# Patient Record
Sex: Female | Born: 1965 | Hispanic: No | Marital: Married | Smoking: Never smoker
Health system: Southern US, Community
[De-identification: ages and names within clinical notes are randomized; demographics above are authoritative.]

## PROBLEM LIST (undated history)

## (undated) ENCOUNTER — Emergency Department (HOSPITAL_COMMUNITY): Admission: EM | Payer: Self-pay | Source: Home / Self Care

---

## 2005-09-09 ENCOUNTER — Other Ambulatory Visit: Admission: RE | Admit: 2005-09-09 | Discharge: 2005-09-09 | Payer: Self-pay | Admitting: Obstetrics and Gynecology

## 2008-07-20 ENCOUNTER — Emergency Department (HOSPITAL_COMMUNITY): Admission: EM | Admit: 2008-07-20 | Discharge: 2008-07-20 | Payer: Self-pay | Admitting: Emergency Medicine

## 2011-12-14 ENCOUNTER — Emergency Department (HOSPITAL_COMMUNITY)
Admission: EM | Admit: 2011-12-14 | Discharge: 2011-12-14 | Disposition: A | Discharge status: Home / Self Care | Payer: Managed Care, Other (non HMO) | Attending: Emergency Medicine | Admitting: Emergency Medicine

## 2011-12-14 ENCOUNTER — Emergency Department (HOSPITAL_COMMUNITY): Payer: Managed Care, Other (non HMO)

## 2011-12-14 ENCOUNTER — Encounter (HOSPITAL_COMMUNITY): Payer: Self-pay | Admitting: *Deleted

## 2011-12-14 DIAGNOSIS — M25519 Pain in unspecified shoulder: Secondary | ICD-10-CM | POA: Insufficient documentation

## 2011-12-14 DIAGNOSIS — M25559 Pain in unspecified hip: Secondary | ICD-10-CM | POA: Insufficient documentation

## 2011-12-14 DIAGNOSIS — S40019A Contusion of unspecified shoulder, initial encounter: Secondary | ICD-10-CM | POA: Insufficient documentation

## 2011-12-14 DIAGNOSIS — S7000XA Contusion of unspecified hip, initial encounter: Secondary | ICD-10-CM | POA: Insufficient documentation

## 2011-12-14 DIAGNOSIS — S7001XA Contusion of right hip, initial encounter: Secondary | ICD-10-CM

## 2011-12-14 DIAGNOSIS — S40011A Contusion of right shoulder, initial encounter: Secondary | ICD-10-CM

## 2011-12-14 DIAGNOSIS — IMO0002 Reserved for concepts with insufficient information to code with codable children: Secondary | ICD-10-CM | POA: Insufficient documentation

## 2011-12-14 DIAGNOSIS — T07XXXA Unspecified multiple injuries, initial encounter: Secondary | ICD-10-CM

## 2011-12-14 LAB — URINALYSIS, ROUTINE W REFLEX MICROSCOPIC
Bilirubin Urine: NEGATIVE
Glucose, UA: NEGATIVE mg/dL
Hgb urine dipstick: NEGATIVE
Ketones, ur: NEGATIVE mg/dL
Nitrite: NEGATIVE
Protein, ur: NEGATIVE mg/dL
Specific Gravity, Urine: 1.015 (ref 1.005–1.030)
Urobilinogen, UA: 0.2 mg/dL (ref 0.0–1.0)
pH: 6.5 (ref 5.0–8.0)

## 2011-12-14 LAB — POCT PREGNANCY, URINE: Preg Test, Ur: NEGATIVE

## 2011-12-14 LAB — URINE MICROSCOPIC-ADD ON

## 2011-12-14 MED ORDER — ONDANSETRON 8 MG PO TBDP
8.0000 mg | ORAL_TABLET | Freq: Once | ORAL | Status: AC
  Administered 2011-12-14: 8 mg via ORAL
  Filled 2011-12-14: qty 1

## 2011-12-14 MED ORDER — HYDROMORPHONE HCL PF 1 MG/ML IJ SOLN
1.0000 mg | Freq: Once | INTRAMUSCULAR | Status: AC
  Administered 2011-12-14: 1 mg via INTRAMUSCULAR
  Filled 2011-12-14: qty 1

## 2011-12-14 MED ORDER — OXYCODONE-ACETAMINOPHEN 5-325 MG PO TABS: 1.0000 | ORAL_TABLET | ORAL | Status: AC | PRN

## 2011-12-14 MED ORDER — IBUPROFEN 400 MG PO TABS: 400.0000 mg | ORAL_TABLET | Freq: Four times a day (QID) | ORAL | Status: AC | PRN

## 2011-12-14 MED ORDER — DIAZEPAM 5 MG PO TABS: 5.0000 mg | ORAL_TABLET | Freq: Three times a day (TID) | ORAL | Status: AC | PRN
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
## 2011-12-14 NOTE — ED Notes (Signed)
Natalia Leatherwood, NP to bedside for evaluation.

## 2011-12-14 NOTE — ED Notes (Signed)
Pt alert and oriented x4. Respirations even and unlabored, bilateral symmetrical rise and fall of chest. Skin warm and dry. In no acute distress. Denies needs.   

## 2011-12-14 NOTE — ED Notes (Signed)
md at bedside
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
## 2011-12-14 NOTE — Discharge Instructions (Signed)
Please read over the instructions below. Your xrays today were normal. You have many bruises and abrasions. Expect to be very sore over the next few days. Use ice to your sore areas for the first 24 hours then heat. Take the medications as directed. Return for worsening symptoms, otherwise follow up with your primary care doctor for a recheck upon your return home.      Hip Injury You have a been diagnosed with a hip injury. Falls can cause fractures to the pelvis and hip as well as very painful bruises. These are called 'hip pointers'. Muscle injuries, arthritis, sciatica, and bursitis and can also cause severe pelvic or hip pain. An x-ray exam will usually show a fractured bone, but sometimes other studies like a CT scan or MRI may be needed to be certain about the diagnosis. All painful hip injuries should be treated like there might be a fracture until they are better or determined not to be fractures. Rest in bed over the next 3-4 days, or as long as you have severe pain. You should not bear weight on your injured hip. Use crutches or a walker. Ice packs can be applied to the injury site for 20 minutes every 2-4 hours for several days. Pain medicine may be needed to help you rest, especially at night.  A follow-up exam and further studies to determine the cause of your pain are important if your symptoms do not improve rapidly over the next week.  SEEK IMMEDIATE MEDICAL CARE IF:  You re-injure your hip.   You develop more pain, a fever, inability to walk, or other problems.  MAKE SURE YOU:   Understand these instructions.   Will watch your condition.   Will get help right away if you are not doing well or get worse.  Document Released: 10/23/2004 Document Revised: 09/04/2011 Document Reviewed: 01/04/2009 [facility] Patient Information 2012 [place], [place].Abrasions Abrasions are skin scrapes. Their treatment depends on how large and deep the abrasion is. Abrasions do not extend through all  layers of the skin. A cut or lesion through all skin layers is called a laceration. HOME CARE INSTRUCTIONS   If you were given a dressing, change it at least once a day or as instructed by your caregiver. If the bandage sticks, soak it off with a solution of water or hydrogen peroxide.   Twice a day, wash the area with soap and water to remove all the cream/ointment. You may do this in a sink, under a tub faucet, or in a shower. Rinse off the soap and pat dry with a clean towel. Look for signs of infection (see below).   Reapply cream/ointment according to your caregiver's instruction. This will help prevent infection and keep the bandage from sticking. Telfa or gauze over the wound and under the dressing or wrap will also help keep the bandage from sticking.   If the bandage becomes wet, dirty, or develops a foul smell, change it as soon as possible.   Only take over-the-counter or prescription medicines for pain, discomfort, or fever as directed by your caregiver.  SEEK IMMEDIATE MEDICAL CARE IF:   Increasing pain in the wound.   Signs of infection develop: redness, swelling, surrounding area is tender to touch, or pus coming from the wound.   You have a fever.   Any foul smell coming from the wound or dressing.  Most skin wounds heal within ten days. Facial wounds heal faster. However, an infection may occur despite proper treatment. You  should have the wound checked for signs of infection within 24 to 48 hours or sooner if problems arise. If you were not given a wound-check appointment, look closely at the wound yourself on the second day for early signs of infection listed above. MAKE SURE YOU:   Understand these instructions.   Will watch your condition.   Will get help right away if you are not doing well or get worse.  Document Released: 06/25/2005 Document Revised: 09/04/2011 Document Reviewed: 08/19/2011 [facility] Patient Information 2012 [place], [place].Contusion A contusion  is a deep bruise. Contusions are the result of an injury that caused bleeding under the skin. The contusion may turn blue, purple, or yellow. Minor injuries will give you a painless contusion, but more severe contusions may stay painful and swollen for a few weeks.  CAUSES  A contusion is usually caused by a blow, trauma, or direct force to an area of the body. SYMPTOMS   Swelling and redness of the injured area.   Bruising of the injured area.   Tenderness and soreness of the injured area.   Pain.  DIAGNOSIS  The diagnosis can be made by taking a history and physical exam. An X-ray, CT scan, or MRI may be needed to determine if there were any associated injuries, such as fractures. TREATMENT  Specific treatment will depend on what area of the body was injured. In general, the best treatment for a contusion is resting, icing, elevating, and applying cold compresses to the injured area. Over-the-counter medicines may also be recommended for pain control. Ask your caregiver what the best treatment is for your contusion. HOME CARE INSTRUCTIONS   Put ice on the injured area.   Put ice in a plastic bag.   Place a towel between your skin and the bag.   Leave the ice on for 15 to 20 minutes, 3 to 4 times a day.   Only take over-the-counter or prescription medicines for pain, discomfort, or fever as directed by your caregiver. Your caregiver may recommend avoiding anti-inflammatory medicines (aspirin, ibuprofen, and naproxen) for 48 hours because these medicines may increase bruising.   Rest the injured area.   If possible, elevate the injured area to reduce swelling.  SEEK IMMEDIATE MEDICAL CARE IF:   You have increased bruising or swelling.   You have pain that is getting worse.   Your swelling or pain is not relieved with medicines.  MAKE SURE YOU:   Understand these instructions.   Will watch your condition.   Will get help right away if you are not doing well or get worse.    Document Released: 06/25/2005 Document Revised: 09/04/2011 Document Reviewed: 07/21/2011 [facility] Patient Information 2012 [place], [place].

## 2011-12-14 NOTE — ED Notes (Signed)
Patient transported to X-ray 
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
## 2011-12-17 NOTE — ED Provider Notes (Signed)
Medical screening examination/treatment/procedure(s) were performed by non-physician practitioner and as supervising physician I was immediately available for consultation/collaboration.   Gwyneth Sprout, MD 12/17/11 1530

## 2013-01-19 ENCOUNTER — Encounter (INDEPENDENT_AMBULATORY_CARE_PROVIDER_SITE_OTHER): Payer: Commercial Managed Care - PPO | Admitting: Ophthalmology

## 2013-01-19 DIAGNOSIS — H3530 Unspecified macular degeneration: Secondary | ICD-10-CM

## 2013-01-19 DIAGNOSIS — H43819 Vitreous degeneration, unspecified eye: Secondary | ICD-10-CM

## 2013-03-22 ENCOUNTER — Encounter (HOSPITAL_COMMUNITY): Payer: Self-pay | Admitting: Emergency Medicine

## 2013-03-22 ENCOUNTER — Emergency Department (HOSPITAL_COMMUNITY)
Admission: EM | Admit: 2013-03-22 | Discharge: 2013-03-22 | Disposition: A | Discharge status: Home / Self Care | Payer: Commercial Managed Care - PPO | Attending: Emergency Medicine | Admitting: Emergency Medicine

## 2013-03-22 DIAGNOSIS — Y9389 Activity, other specified: Secondary | ICD-10-CM | POA: Insufficient documentation

## 2013-03-22 DIAGNOSIS — Z23 Encounter for immunization: Secondary | ICD-10-CM | POA: Insufficient documentation

## 2013-03-22 DIAGNOSIS — Y929 Unspecified place or not applicable: Secondary | ICD-10-CM | POA: Insufficient documentation

## 2013-03-22 DIAGNOSIS — W5501XA Bitten by cat, initial encounter: Secondary | ICD-10-CM

## 2013-03-22 DIAGNOSIS — IMO0001 Reserved for inherently not codable concepts without codable children: Secondary | ICD-10-CM | POA: Insufficient documentation

## 2013-03-22 DIAGNOSIS — S61209A Unspecified open wound of unspecified finger without damage to nail, initial encounter: Secondary | ICD-10-CM | POA: Insufficient documentation

## 2013-03-22 MED ORDER — RABIES VACCINE, PCEC IM SUSR
1.0000 mL | Freq: Once | INTRAMUSCULAR | Status: AC
  Administered 2013-03-22: 1 mL via INTRAMUSCULAR
  Filled 2013-03-22: qty 1

## 2013-03-22 MED ORDER — RABIES IMMUNE GLOBULIN 150 UNIT/ML IM INJ
20.0000 [IU]/kg | INJECTION | Freq: Once | INTRAMUSCULAR | Status: AC
  Administered 2013-03-22: 1425 [IU] via INTRAMUSCULAR
  Filled 2013-03-22: qty 9.5

## 2013-03-22 NOTE — ED Notes (Signed)
Reviewed Rabies Vaccine schedule.

## 2013-03-22 NOTE — ED Notes (Signed)
Bitten by cat on rt ring finger some redness

## 2013-03-22 NOTE — ED Provider Notes (Signed)
History    CSN: [id number] Arrival date & time 03/22/13  1249  First MD Initiated Contact with Patient 03/22/13 1351     Chief Complaint  Patient presents with  . Animal Bite   (Consider location/radiation/quality/duration/timing/severity/associated sxs/prior Treatment) HPI Comments: 47 y.o. Female with no significant PMHx presents today complaining of a cat bite earlier today by a feral cat on the volar aspect of the distal phalanx of the 4th finger of her right hand. Rabies status is unknown. Tetanus is UTD. Pt was seen at [facility] earlier and given a script for a course of Augmentin. Was sent to ED for first dose of rabies vaccination. Pt states she is not in any pain. Bleeding is controlled. ROM is not impaired. No repair is needed at this time.   Patient is a 47 y.o. female presenting with animal bite. The history is provided by the patient.  Animal Bite Contact animal:  Cat Animal bite location: volar aspect of the distal phalanx of the 4th finger of her right hand. Pain details:    Severity:  No pain   Progression:  Improving [place] Provoked: unprovoked   Notifications:  None Animal's rabies vaccination status:  Never received Tetanus status:  Up to date Relieved by:  None tried Worsened by:  Nothing tried Ineffective treatments:  None tried Associated symptoms: no fever, no numbness and no swelling    History reviewed. No pertinent past medical history. No past surgical history on file. No family history on file. History  Substance Use Topics  . Smoking status: Never Smoker   . Smokeless tobacco: Not on file  . Alcohol Use: Yes   OB History   Grav Para Term Preterm Abortions TAB SAB Ect Mult Living                 Review of Systems  Constitutional: Negative for fever and diaphoresis.  HENT: Negative for neck pain and neck stiffness.   Eyes: Negative for visual disturbance.  Respiratory: Negative for  apnea, chest tightness and shortness of breath.   Cardiovascular: Negative for chest pain and palpitations.  Gastrointestinal: Negative for nausea, vomiting, diarrhea and constipation.  Genitourinary: Negative for dysuria.  Musculoskeletal: Negative for gait problem.  Skin: Positive for wound.       volar aspect of the distal phalanx of the 4th finger of her right hand  Neurological: Negative for dizziness, weakness, light-headedness, numbness and headaches.    Allergies  Review of patient's allergies indicates no known allergies.  Home Medications  No current outpatient prescriptions on file. BP 127/81  Pulse 80  Temp(Src) 98.1 F (36.7 C)  Resp 16  Ht 5\' 7"  (1.702 m)  Wt 154 lb (69.854 kg)  BMI 24.11 kg/m2  SpO2 99% Physical Exam  Nursing note and vitals reviewed. Constitutional: She is oriented to person, place, and time. She appears well-developed and well-nourished. No distress.  HENT:  Head: Normocephalic and atraumatic.  Eyes: Conjunctivae and EOM are normal.  Neck: Normal range of motion. Neck supple.  No meningeal signs  Cardiovascular: Normal rate, regular rhythm, normal heart sounds and intact distal pulses.  Exam reveals no gallop and no friction rub.   No murmur heard. Pulmonary/Chest: Effort normal and breath sounds normal. No respiratory distress. She has no wheezes. She has no rales. She exhibits no tenderness.  Abdominal: Soft. Bowel sounds are normal. She exhibits no distension. There is no tenderness. There is no rebound  and no guarding.  Musculoskeletal: Normal range of motion. She exhibits no edema and no tenderness.  FROM to upper and lower extremities and all digits bilaterally  Neurological: She is alert and oriented to person, place, and time. No cranial nerve deficit.  Speech is clear and goal oriented, follows commands Sensation normal to light touch and two point discrimination intact.  Moves extremities without ataxia, coordination intact Normal  gait and balance Normal strength in upper and lower extremities bilaterally including dorsiflexion and plantar flexion, strong and equal grip strength   Skin: Skin is warm and dry. She is not diaphoretic. No erythema.  volar aspect of the distal phalanx of the 4th finger of her right hand. Marland Kitchen025cm. Deep to epidermis. No bleeding. No bruising. No swelling.     ED Course  Procedures (including critical care time) Labs Reviewed - No data to display No results found. 1. Cat bite of finger, initial encounter     MDM  Pt with bite from feral cat volar aspect of the distal phalanx of the 4th finger of her right hand. No repair needed. Pt received script for Augmentin at urgent care earlier today. Here for first does of rabies series. Pt tolerated well. Discussed follow up at Day 3, 7, 14. Discussed reasons to seek immediate care. Patient expresses understanding and agrees with plan.   Glade Nurse, PA-C 03/22/13 2220

## 2013-03-23 ENCOUNTER — Encounter (HOSPITAL_COMMUNITY): Payer: Self-pay | Admitting: *Deleted

## 2013-03-25 ENCOUNTER — Emergency Department (HOSPITAL_COMMUNITY)
Admission: EM | Admit: 2013-03-25 | Discharge: 2013-03-25 | Disposition: A | Discharge status: Home / Self Care | Payer: Commercial Managed Care - PPO | Attending: Emergency Medicine | Admitting: Emergency Medicine

## 2013-03-25 ENCOUNTER — Encounter (HOSPITAL_COMMUNITY): Payer: Self-pay

## 2013-03-25 DIAGNOSIS — Z23 Encounter for immunization: Secondary | ICD-10-CM | POA: Insufficient documentation

## 2013-03-25 MED ORDER — RABIES VACCINE, PCEC IM SUSR
1.0000 mL | Freq: Once | INTRAMUSCULAR | Status: AC
  Administered 2013-03-25: 1 mL via INTRAMUSCULAR
  Filled 2013-03-25: qty 1

## 2013-03-25 NOTE — ED Provider Notes (Signed)
   History    CSN: [id number] Arrival date & time 03/25/13  1220  None    No chief complaint on file.  (Consider location/radiation/quality/duration/timing/severity/associated sxs/prior Treatment) The history is provided by the patient.    Presents to the ED for rabies vaccination. Patient was bit a stray cat on 03/21/2013 on her right ring finger. She was seen in the ED, and received first dose of rabies vaccination and rabies immune globulin.  Wound has been healing well. There is no surrounding redness, swelling, or purulent drainage. No fever sweats or chills. Animal control has contacted patient, they have found a cat and will proceed with rabies testing.  Has been taking abx as directed.  History reviewed. No pertinent past medical history. History reviewed. No pertinent past surgical history. History reviewed. No pertinent family history. History  Substance Use Topics  . Smoking status: Never Smoker   . Smokeless tobacco: Not on file  . Alcohol Use: Yes     Comment: occasionally   OB History   Grav Para Term Preterm Abortions TAB SAB Ect Mult Living                 Review of Systems  Skin: Positive for wound.       Rabies vaccination  All other systems reviewed and are negative.    Allergies  Review of patient's allergies indicates no known allergies.  Home Medications   Current Outpatient Rx  Name  Route  Sig  Dispense  Refill  . loratadine (CLARITIN) 10 MG tablet   Oral   Take 10 mg by mouth daily as needed. For allergies         . naphazoline (CLEAR EYES) 0.012 % ophthalmic solution   Both Eyes   Place 1 drop into both eyes 4 (four) times daily as needed. For dry/irritated eyes          BP 129/81  Pulse 74  Temp(Src) 98.1 F (36.7 C) (Oral)  Resp 14  SpO2 98%  LMP 03/25/2013  Physical Exam  Nursing note and vitals reviewed. Constitutional: She is oriented to person, place, and time. She appears well-developed and well-nourished.  HENT:   Head: Normocephalic and atraumatic.  Mouth/Throat: Oropharynx is clear and moist.  Eyes: Conjunctivae and EOM are normal. Pupils are equal, round, and reactive to light.  Neck: Normal range of motion.  Cardiovascular: Normal rate, regular rhythm and normal heart sounds.   Pulmonary/Chest: Effort normal and breath sounds normal.  Abdominal: Soft. Bowel sounds are normal.  Musculoskeletal: Normal range of motion.  Bite to right ring finger healing well without surrounding swelling, erythema, or signs of infection  Neurological: She is alert and oriented to person, place, and time.  Skin: Skin is warm and dry.  Psychiatric: She has a normal mood and affect.    ED Course  Procedures (including critical care time) Labs Reviewed - No data to display No results found.  1. Rabies, need for prophylactic vaccination against     MDM   Wound healing well without signs of infection. Patient given second rabies vaccination. Instructed to followup for rest of rabies series according to her schedule. Informed that if she does not complete the series vaccinations may not be fully effective. Patient acknowledged understanding and agreed. Return precautions advised.  Garlon Hatchet, PA-C 03/25/13 1514

## 2013-03-25 NOTE — ED Notes (Signed)
Pt was bitten by stray cat 5 days ago.  Pt seen in this ED for treatment.  Pt here for second dose of vaccines.  Pt alert oriented X4 no signs of infection or pain

## 2013-03-25 NOTE — ED Notes (Signed)
Pt presents for 2nd dose of rabies vaccine.  Pt reports she was bit by a stray cat, was treated here for initial injury.

## 2013-03-26 NOTE — ED Provider Notes (Signed)
History/physical exam/procedure(s) were performed by non-physician practitioner and as supervising physician I was immediately available for consultation/collaboration. I have reviewed all notes and am in agreement with care and plan.   Myking Sar S Shulem Mader, MD 03/26/13 1228 

## 2013-03-28 NOTE — ED Provider Notes (Signed)
Medical screening examination/treatment/procedure(s) were performed by non-physician practitioner and as supervising physician I was immediately available for consultation/collaboration.  Holger Sokolowski R. Verania Salberg, MD 03/28/13 1502 

## 2013-04-04 ENCOUNTER — Emergency Department (HOSPITAL_COMMUNITY)
Admission: EM | Admit: 2013-04-04 | Discharge: 2013-04-04 | Disposition: A | Discharge status: Home / Self Care | Payer: Commercial Managed Care - PPO | Source: Home / Self Care

## 2013-04-04 DIAGNOSIS — Z203 Contact with and (suspected) exposure to rabies: Secondary | ICD-10-CM

## 2013-04-04 MED ORDER — RABIES VACCINE, PCEC IM SUSR
INTRAMUSCULAR | Status: AC
  Filled 2013-04-04: qty 1

## 2013-04-04 MED ORDER — RABIES VACCINE, PCEC IM SUSR
1.0000 mL | Freq: Once | INTRAMUSCULAR | Status: AC
  Administered 2013-04-04: 1 mL via INTRAMUSCULAR

## 2013-04-11 ENCOUNTER — Encounter (HOSPITAL_COMMUNITY): Payer: Self-pay | Admitting: *Deleted

## 2013-04-11 ENCOUNTER — Emergency Department (INDEPENDENT_AMBULATORY_CARE_PROVIDER_SITE_OTHER)
Admission: EM | Admit: 2013-04-11 | Discharge: 2013-04-11 | Disposition: A | Discharge status: Home / Self Care | Payer: Commercial Managed Care - PPO | Source: Home / Self Care

## 2013-04-11 DIAGNOSIS — Z203 Contact with and (suspected) exposure to rabies: Secondary | ICD-10-CM

## 2013-04-11 MED ORDER — RABIES VACCINE, PCEC IM SUSR
1.0000 mL | Freq: Once | INTRAMUSCULAR | Status: AC
  Administered 2013-04-11: 1 mL via INTRAMUSCULAR

## 2013-04-11 MED ORDER — RABIES VACCINE, PCEC IM SUSR
INTRAMUSCULAR | Status: AC
  Filled 2013-04-11: qty 1

## 2013-04-11 NOTE — ED Notes (Addendum)
Here for her last rabies vaccine. Was off schedule and got 3 rd shot on 7/7 instead of 7/1.  No side effects from injections.

## 2015-05-10 ENCOUNTER — Ambulatory Visit: Payer: Self-pay | Admitting: Certified Nurse Midwife

## 2016-10-02 ENCOUNTER — Other Ambulatory Visit: Payer: Self-pay | Admitting: Obstetrics and Gynecology

## 2016-10-02 DIAGNOSIS — R928 Other abnormal and inconclusive findings on diagnostic imaging of breast: Secondary | ICD-10-CM

## 2016-10-09 ENCOUNTER — Ambulatory Visit
Admission: RE | Admit: 2016-10-09 | Discharge: 2016-10-09 | Disposition: A | Discharge status: Home / Self Care | Payer: Commercial Managed Care - PPO | Source: Ambulatory Visit | Attending: Obstetrics and Gynecology | Admitting: Obstetrics and Gynecology

## 2016-10-09 DIAGNOSIS — R928 Other abnormal and inconclusive findings on diagnostic imaging of breast: Secondary | ICD-10-CM

## 2018-07-21 IMAGING — MG DIGITAL DIAGNOSTIC UNILATERAL RIGHT MAMMOGRAM
4 series · 4 of 4 positions shown · non-contrast
Comparison: Previous exam(s).

CLINICAL DATA: The patient was called back from screening
mammography due to right breast calcifications.

EXAM:
DIGITAL DIAGNOSTIC RIGHT MAMMOGRAM WITH CAD

[R ML (1 of 3)]
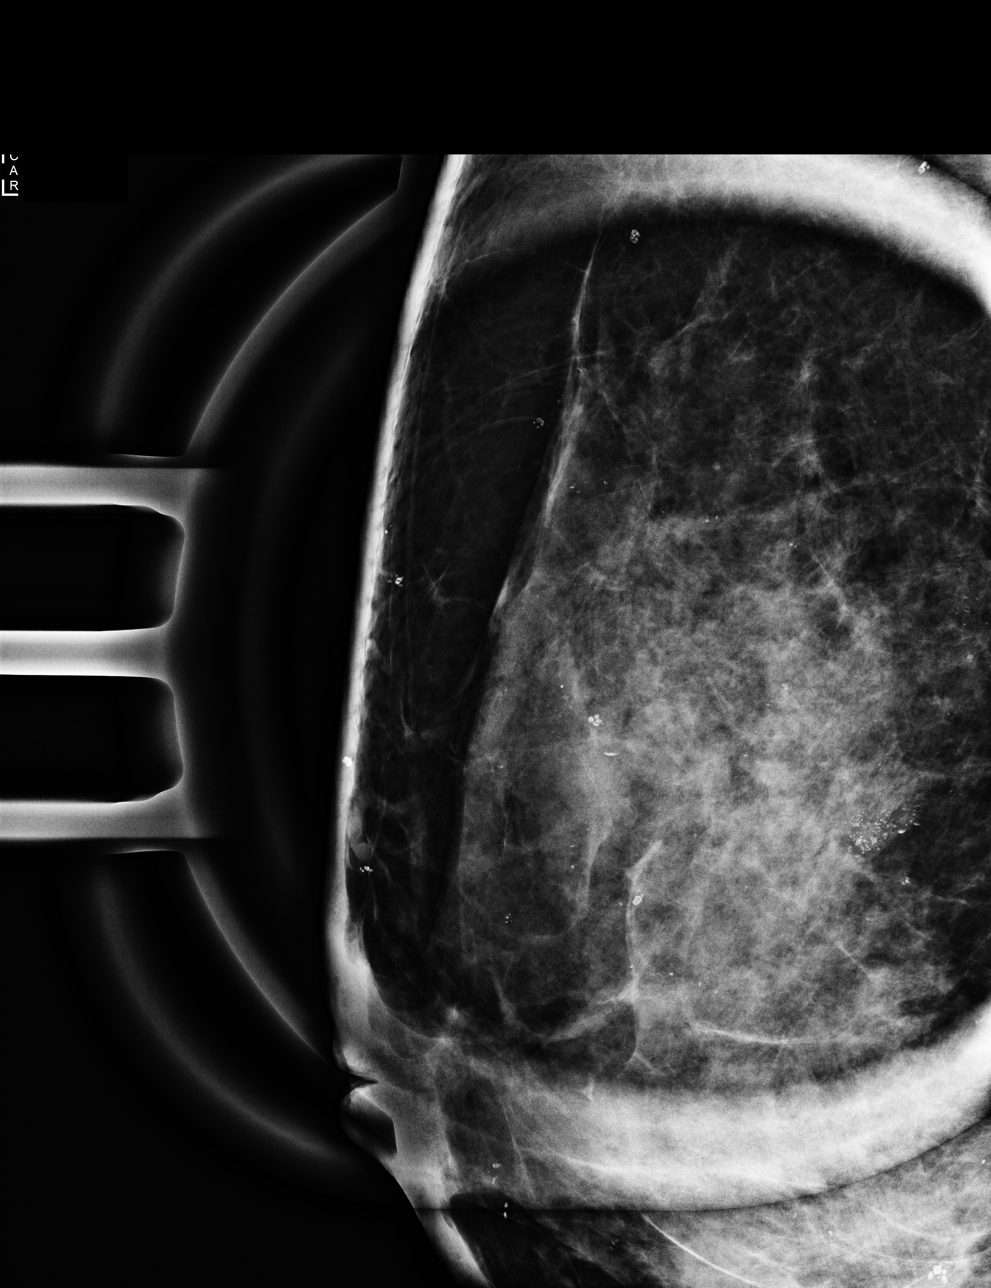

[R ML (2 of 3)]
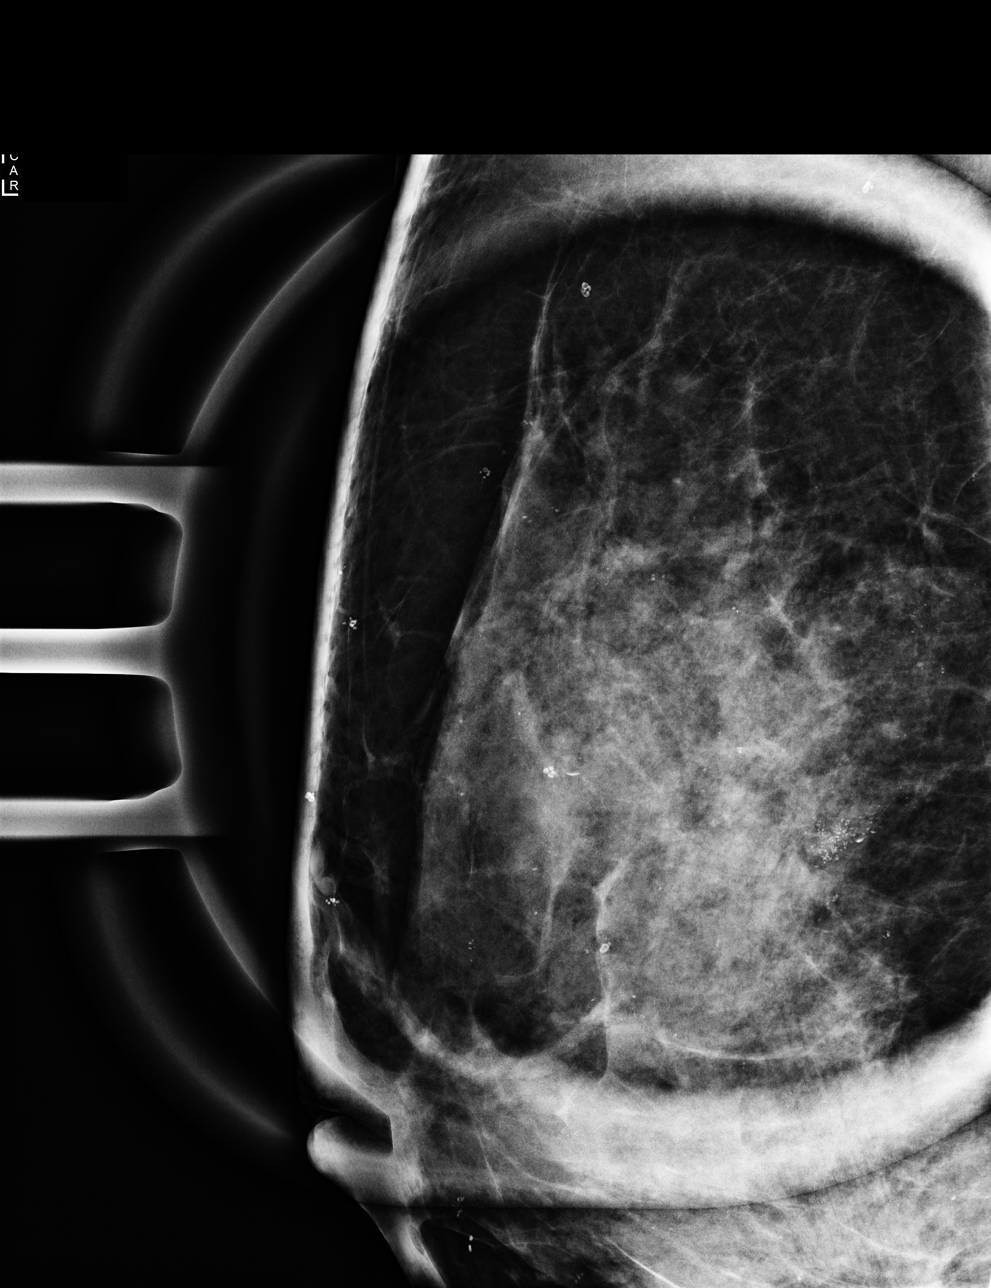

[R CC]
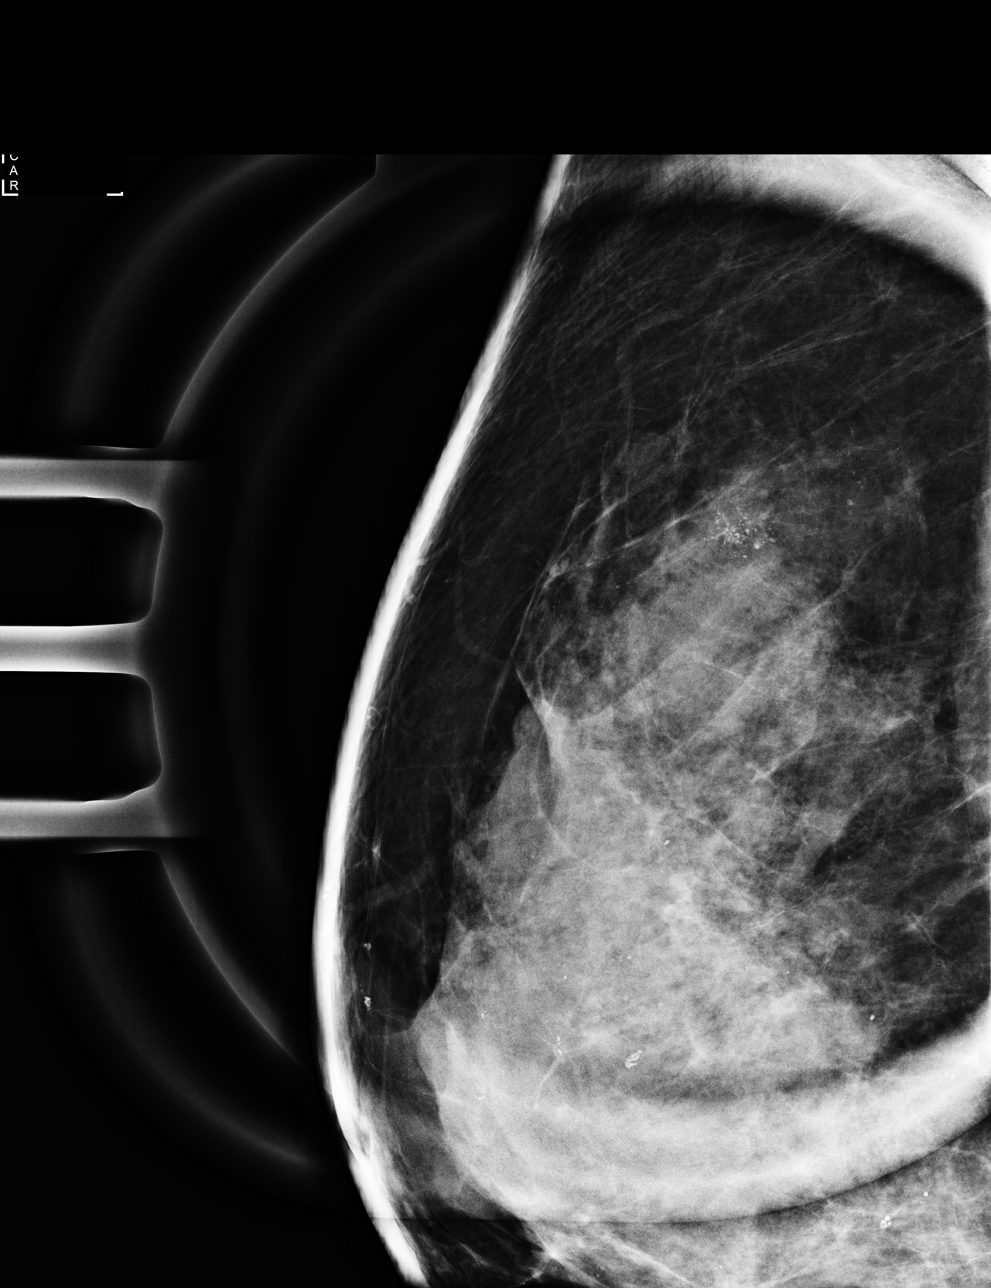

[R ML (3 of 3)]
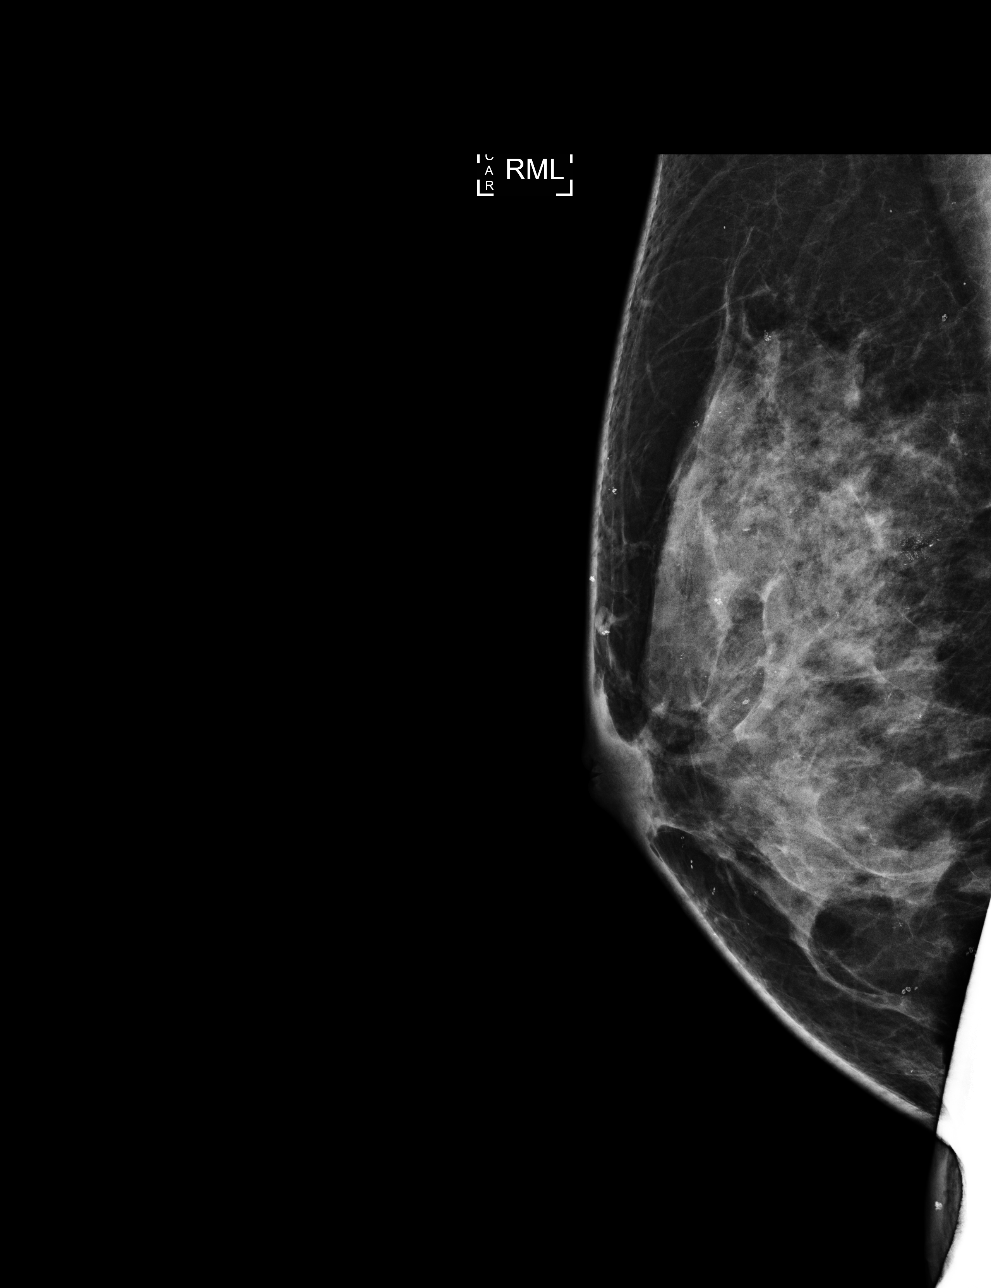

[4 of 4 positions shown; findings below may reference images not displayed]

ACR Breast Density Category c: The breast tissue is heterogeneously
dense, which may obscure small masses.
FINDINGS: The calcifications in the lateral central right breast persist on
additional imaging. A few of the more anterior coarse calcifications
layer.

Mammographic images were processed with CAD.
IMPRESSION: Probably benign right breast calcifications.

RECOMMENDATION:
Six-month follow-up mammography to ensure stability of the probably
benign right breast calcifications.

I have discussed the findings and recommendations with the patient.
Results were also provided in writing at the conclusion of the
visit. If applicable, a reminder letter will be sent to the patient
regarding the next appointment.

BI-RADS CATEGORY  3: Probably benign.

## 2023-05-01 ENCOUNTER — Ambulatory Visit: Payer: Commercial Managed Care - PPO | Admitting: Family Medicine

## 2023-05-01 NOTE — Progress Notes (Deleted)
   Rubin Payor, PhD, LAT, ATC acting as a scribe for Clementeen Graham, MD.  Julia Montgomery is a 57 y.o. female who presents to [facility] today for  Pertinent review of systems: ***  Relevant historical information: ***   Exam:  There were no vitals taken for this visit. General: Well Developed, well nourished, and in no acute distress.   MSK: ***    Lab and Radiology Results No results found for this or any previous visit (from the past 72 hour(s)). No results found.     Assessment and Plan: 57 y.o. female with ***   PDMP not reviewed this encounter. No orders of the defined types were placed in this encounter.  No orders of the defined types were placed in this encounter.    Discussed warning signs or symptoms. Please see discharge instructions. Patient expresses understanding.   ***
